# Patient Record
Sex: Female | Born: 1960 | Race: Black or African American | Hispanic: No | Marital: Single | Smoking: Never smoker
Health system: Southern US, Community
[De-identification: ages and names within clinical notes are randomized; demographics above are authoritative.]

## PROBLEM LIST (undated history)

## (undated) DIAGNOSIS — I10 Essential (primary) hypertension: Secondary | ICD-10-CM

## (undated) DIAGNOSIS — T7840XA Allergy, unspecified, initial encounter: Secondary | ICD-10-CM

## (undated) HISTORY — PX: ABDOMINAL HYSTERECTOMY: SHX81

## (undated) HISTORY — DX: Allergy, unspecified, initial encounter: T78.40XA

## (undated) HISTORY — PX: BREAST EXCISIONAL BIOPSY: SUR124

---

## 2017-09-18 ENCOUNTER — Other Ambulatory Visit: Payer: Self-pay | Admitting: Nurse Practitioner

## 2017-09-18 DIAGNOSIS — Z1231 Encounter for screening mammogram for malignant neoplasm of breast: Secondary | ICD-10-CM

## 2017-09-30 ENCOUNTER — Ambulatory Visit
Admission: RE | Admit: 2017-09-30 | Discharge: 2017-09-30 | Disposition: A | Discharge status: Home / Self Care | Payer: BLUE CROSS/BLUE SHIELD | Source: Ambulatory Visit | Attending: Specialist | Admitting: Specialist

## 2017-09-30 DIAGNOSIS — Z1231 Encounter for screening mammogram for malignant neoplasm of breast: Secondary | ICD-10-CM

## 2017-10-04 ENCOUNTER — Ambulatory Visit (INDEPENDENT_AMBULATORY_CARE_PROVIDER_SITE_OTHER): Payer: Worker's Compensation

## 2017-10-04 ENCOUNTER — Ambulatory Visit (HOSPITAL_COMMUNITY)
Admission: EM | Admit: 2017-10-04 | Discharge: 2017-10-04 | Disposition: A | Discharge status: Home / Self Care | Payer: Worker's Compensation | Attending: Family Medicine | Admitting: Family Medicine

## 2017-10-04 ENCOUNTER — Other Ambulatory Visit: Payer: Self-pay

## 2017-10-04 ENCOUNTER — Encounter (HOSPITAL_COMMUNITY): Payer: Self-pay

## 2017-10-04 DIAGNOSIS — S5011XA Contusion of right forearm, initial encounter: Secondary | ICD-10-CM

## 2017-10-04 DIAGNOSIS — M79631 Pain in right forearm: Secondary | ICD-10-CM

## 2017-10-04 DIAGNOSIS — W19XXXA Unspecified fall, initial encounter: Secondary | ICD-10-CM

## 2017-10-04 DIAGNOSIS — S134XXA Sprain of ligaments of cervical spine, initial encounter: Secondary | ICD-10-CM

## 2017-10-04 DIAGNOSIS — M542 Cervicalgia: Secondary | ICD-10-CM

## 2017-10-04 HISTORY — DX: Essential (primary) hypertension: I10

## 2017-10-04 NOTE — ED Triage Notes (Signed)
Fell at work 8 :30 pm . 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 Right forearm and neck ribs on the right side and lower back pain.

## 2017-10-04 NOTE — ED Provider Notes (Signed)
[facility]    CSN: [id number] Arrival date & time: 10/04/17  1653     History   Chief Complaint Chief Complaint  Patient presents with  . Fall    HPI Judith Richards is a 57 y.o. female.   Patient tripped over a pallet last night at work.  She landed primarily on her right forearm.  Several hours later she started to feel pain in her neck as well as her right chest and abdomen.  HPI  Past Medical History:  Diagnosis Date  . Hypertension     There are no active problems to display for this patient.   Past Surgical History:  Procedure Laterality Date  . ABDOMINAL HYSTERECTOMY    . BREAST EXCISIONAL BIOPSY Right over 15 years ago   benign    OB History   None      Home Medications    Prior to Admission medications   Medication Sig Start Date End Date Taking? Authorizing Provider  amlodipine-atorvastatin (CADUET) 10-10 MG tablet Take 1 tablet by mouth daily.   Yes [provider]  lisinopril (PRINIVIL,ZESTRIL) 40 MG tablet Take 40 mg by mouth daily.   Yes [provider]    Family History Family History  Problem Relation Age of Onset  . Hypertension Mother   . Asthma Mother     Social History Social History   Tobacco Use  . Smoking status: Never Smoker  . Smokeless tobacco: Never Used  Substance Use Topics  . Alcohol use: Yes    Comment: occ  . Drug use: Not Currently     Allergies   Patient has no allergy information on record.   Review of Systems Review of Systems  Constitutional: Negative.   HENT: Negative.   Respiratory: Negative.   Cardiovascular: Negative.   Gastrointestinal: Positive for abdominal pain.  Musculoskeletal: Positive for neck pain.     Physical Exam Triage Vital Signs ED Triage Vitals  Enc Vitals Group     BP 10/04/17 1810 (!) 173/93     Pulse Rate 10/04/17 1810 73     Resp 10/04/17 1810 16     Temp 10/04/17 1810 98.6 F (37 C)     Temp src --      SpO2 --      Weight 10/04/17  1814 183 lb (83 kg)     Height --      Head Circumference --      Peak Flow --      Pain Score 10/04/17 1814 8     Pain Loc --      Pain Edu? --      Excl. in [place]? --    No data found.  Updated Vital Signs BP (!) 173/93   Pulse 73   Temp 98.6 F (37 C)   Resp 16   Wt 183 lb (83 kg)   Visual Acuity Right Eye Distance:   Left Eye Distance:   Bilateral Distance:    Right Eye Near:   Left Eye Near:    Bilateral Near:     Physical Exam  Constitutional: She appears well-developed and well-nourished.  Cardiovascular:  There is no point tenderness involving the ribs with compression  Pulmonary/Chest: Effort normal.  Abdominal: Soft.  Musculoskeletal:  Right forearm there is bruising and swelling on the volar aspect of the arm.  Elbow has normal range of motion as does the wrist.     UC Treatments / Results  Labs (all labs ordered are  listed, but only abnormal results are displayed) Labs Reviewed - No data to display  EKG None  Radiology No results found.  Procedures Procedures (including critical care time)  Medications Ordered in UC Medications - No data to display  Initial Impression / Assessment and Plan / UC Course  I have reviewed the triage vital signs and the nursing notes.  Pertinent labs & imaging results that were available during my care of the patient were reviewed by me and considered in my medical decision making (see chart for details).     Contusion of right forearm.  Sprain of cervical spine.  Have recommended activities as tolerated and follow-up with occupational health. Final Clinical Impressions(s) / UC Diagnoses   Final diagnoses:  None   Discharge Instructions   None    ED Prescriptions    None     Controlled Substance Prescriptions [place] Controlled Substance Registry consulted? No   Wardell Honour, MD 10/04/17 2005

## 2017-12-18 ENCOUNTER — Encounter: Payer: Self-pay | Admitting: Gastroenterology

## 2018-01-16 ENCOUNTER — Telehealth: Payer: Self-pay | Admitting: *Deleted

## 2018-01-16 ENCOUNTER — Encounter: Payer: Self-pay | Admitting: *Deleted

## 2018-01-16 NOTE — Telephone Encounter (Signed)
Patient no showed for Previsit today. Telephone message left for the patient with call back #. Informed patient to call by 1700 to avoid cancellation of previsit and procedure Called patient at 1703, no answer. Procedure and previsit cancelled

## 2018-01-17 ENCOUNTER — Encounter: Payer: Self-pay | Admitting: Gastroenterology

## 2018-01-22 ENCOUNTER — Ambulatory Visit (AMBULATORY_SURGERY_CENTER): Payer: Self-pay | Admitting: *Deleted

## 2018-01-22 VITALS — Ht 62.0 in | Wt 189.0 lb

## 2018-01-22 DIAGNOSIS — Z800 Family history of malignant neoplasm of digestive organs: Secondary | ICD-10-CM

## 2018-01-22 MED ORDER — NA SULFATE-K SULFATE-MG SULF 17.5-3.13-1.6 GM/177ML PO SOLN: 1.0000 | Freq: Once | ORAL | 0 refills | Status: AC

## 2018-01-22 NOTE — Progress Notes (Signed)
No egg or soy allergy known to patient  No issues with past sedation with any surgeries  or procedures, no intubation problems  No diet pills per patient No home 02 use per patient  No blood thinners per patient  Pt denies issues with constipation  No A fib or A flutter  EMMI video sent to pt's e mail --  Suprep $15 coupon to pt

## 2018-01-23 ENCOUNTER — Encounter: Payer: Self-pay | Admitting: Gastroenterology

## 2018-02-05 ENCOUNTER — Encounter: Payer: Self-pay | Admitting: Gastroenterology

## 2018-02-19 ENCOUNTER — Encounter: Payer: Self-pay | Admitting: Gastroenterology

## 2018-02-19 ENCOUNTER — Ambulatory Visit (AMBULATORY_SURGERY_CENTER): Payer: BLUE CROSS/BLUE SHIELD | Admitting: Gastroenterology

## 2018-02-19 VITALS — BP 166/73 | HR 60 | Temp 96.0°F | Resp 12 | Ht 62.0 in | Wt 189.0 lb

## 2018-02-19 DIAGNOSIS — K635 Polyp of colon: Secondary | ICD-10-CM

## 2018-02-19 DIAGNOSIS — Z1211 Encounter for screening for malignant neoplasm of colon: Secondary | ICD-10-CM | POA: Diagnosis present

## 2018-02-19 DIAGNOSIS — Z800 Family history of malignant neoplasm of digestive organs: Secondary | ICD-10-CM

## 2018-02-19 DIAGNOSIS — K5730 Diverticulosis of large intestine without perforation or abscess without bleeding: Secondary | ICD-10-CM

## 2018-02-19 DIAGNOSIS — D120 Benign neoplasm of cecum: Secondary | ICD-10-CM

## 2018-02-19 MED ORDER — SODIUM CHLORIDE 0.9 % IV SOLN: 500.0000 mL | Freq: Once | INTRAVENOUS | Status: DC

## 2018-02-19 NOTE — Progress Notes (Signed)
Report to PACU, RN, vss, BBS= Clear.  

## 2018-02-19 NOTE — Progress Notes (Signed)
Called to room to assist during endoscopic procedure.  Patient ID and intended procedure confirmed with present staff. Received instructions for my participation in the procedure from the performing physician.  

## 2018-02-19 NOTE — Op Note (Signed)
[facility] Patient Name: Judith Richards Procedure Date: 02/19/2018 8:23 AM MRN: [id number] Endoscopist: Gerrit Heck , MD Age: 57 Referring MD:  Date of Birth: [birth year] Gender: Female Account #: [id number] Procedure:                Colonoscopy Indications:              Screening for colorectal malignant neoplasm, This                            is the patient's first colonoscopy Medicines:                Monitored Anesthesia Care Procedure:                Pre-Anesthesia Assessment:                           - Prior to the procedure, a History and Physical                            was performed, and patient medications and                            allergies were reviewed. The patient's tolerance of                            previous anesthesia was also reviewed. The risks                            and benefits of the procedure and the sedation                            options and risks were discussed with the patient.                            All questions were answered, and informed consent                            was obtained. Prior Anticoagulants: The patient has                            taken no previous anticoagulant or antiplatelet                            agents. ASA Grade Assessment: II - A patient with                            mild systemic disease. After reviewing the risks                            and benefits, the patient was deemed in                            satisfactory condition to undergo the procedure.  After obtaining informed consent, the colonoscope                            was passed under direct vision. Throughout the                            procedure, the patient's blood pressure, pulse, and                            oxygen saturations were monitored continuously. The                            Colonoscope was introduced through the anus and                            advanced to the the  cecum, identified by the                            appendiceal orifice, ileocecal valve and palpation.                            The colonoscopy was performed without difficulty.                            The patient tolerated the procedure well. The                            quality of the bowel preparation was adequate. Scope In: 8:41:07 AM Scope Out: 8:53:15 AM Scope Withdrawal Time: 0 hours 9 minutes 2 seconds  Total Procedure Duration: 0 hours 12 minutes 8 seconds  Findings:                 The perianal and digital rectal examinations were                            normal.                           A 2 mm polyp was found in the cecum. The polyp was                            sessile. The polyp was removed with a cold biopsy                            forceps. Resection and retrieval were complete.                            Estimated blood loss was minimal.                           A few small-mouthed diverticula were found in the                            sigmoid colon and transverse colon.  An area of mild melanosis was found in the sigmoid                            colon and in the distal descending colon.                           The exam was otherwise normal throughout the                            examined colon.                           Retroflexion in the right colon was performed.                           The retroflexed view of the distal rectum and anal                            verge was normal and showed no anal or rectal                            abnormalities.                           The terminal ileum appeared normal. Complications:            No immediate complications. Estimated Blood Loss:     Estimated blood loss was minimal. Impression:               - One 2 mm polyp in the cecum, removed with a cold                            biopsy forceps. Resected and retrieved.                           - Diverticulosis in the  sigmoid colon and in the                            transverse colon.                           - Melanosis in the colon.                           - The distal rectum and anal verge are normal on                            retroflexion view.                           - The examined portion of the ileum was normal. Recommendation:           - Patient has a contact number available for                            emergencies. The signs  and symptoms of potential                            delayed complications were discussed with the                            patient. Return to normal activities tomorrow.                            Written discharge instructions were provided to the                            patient.                           - Resume previous diet today.                           - Continue present medications.                           - Await pathology results.                           - Repeat colonoscopy in 5-10 years for surveillance                            based on pathology results.                           - Return to GI office PRN. Gerrit Heck, MD 02/19/2018 8:59:00 AM

## 2018-02-19 NOTE — Progress Notes (Signed)
Pt's states no medical or surgical changes since previsit or office visit. 

## 2018-02-19 NOTE — Patient Instructions (Signed)
YOU HAD AN ENDOSCOPIC PROCEDURE TODAY AT [facility]:   Refer to the procedure report that was given to you for any specific questions about what was found during the examination.  If the procedure report does not answer your questions, please call your gastroenterologist to clarify.  If you requested that your care partner not be given the details of your procedure findings, then the procedure report has been included in a sealed envelope for you to review at your convenience later.  YOU SHOULD EXPECT: Some feelings of bloating in the abdomen. Passage of more gas than usual.  Walking can help get rid of the air that was put into your GI tract during the procedure and reduce the bloating. If you had a lower endoscopy (such as a colonoscopy or flexible sigmoidoscopy) you may notice spotting of blood in your stool or on the toilet paper. If you underwent a bowel prep for your procedure, you may not have a normal bowel movement for a few days.  Please Note:  You might notice some irritation and congestion in your nose or some drainage.  This is from the oxygen used during your procedure.  There is no need for concern and it should clear up in a day or so.  SYMPTOMS TO REPORT IMMEDIATELY:   Following lower endoscopy (colonoscopy or flexible sigmoidoscopy):  Excessive amounts of blood in the stool  Significant tenderness or worsening of abdominal pains  Swelling of the abdomen that is new, acute  Fever of 100F or higher   For urgent or emergent issues, a gastroenterologist can be reached at any hour by calling [phone].   DIET:  We do recommend a small meal at first, but then you may proceed to your regular diet.  Drink plenty of fluids but you should avoid alcoholic beverages for 24 hours.  ACTIVITY:  You should plan to take it easy for the rest of today and you should NOT DRIVE or use heavy machinery until tomorrow (because of the sedation medicines used during the test).     FOLLOW UP: Our staff will call the number listed on your records the next business day following your procedure to check on you and address any questions or concerns that you may have regarding the information given to you following your procedure. If we do not reach you, we will leave a message.  However, if you are feeling well and you are not experiencing any problems, there is no need to return our call.  We will assume that you have returned to your regular daily activities without incident.  If any biopsies were taken you will be contacted by phone or by letter within the next 1-3 weeks.  Please call us at [phone] if you have not heard about the biopsies in 3 weeks.    SIGNATURES/CONFIDENTIALITY: You and/or your care partner have signed paperwork which will be entered into your electronic medical record.  These signatures attest to the fact that that the information above on your After Visit Summary has been reviewed and is understood.  Full responsibility of the confidentiality of this discharge information lies with you and/or your care-partner.   Handouts were given to your care partner on polyps and diverticulosis. You may resume your current medications today. Await biopsy results. Please call if any questions or concerns.

## 2018-02-19 NOTE — Progress Notes (Signed)
No problems noted in the recovery room. maw 

## 2018-02-20 ENCOUNTER — Telehealth: Payer: Self-pay | Admitting: *Deleted

## 2018-02-20 NOTE — Telephone Encounter (Signed)
  Follow up Call-  Call back number 02/19/2018  Post procedure Call Back phone  # [phone]  Permission to leave phone message Yes  Some recent data might be hidden     Patient questions:  Do you have a fever, pain , or abdominal swelling? No. Pain Score  0 *  Have you tolerated food without any problems? Yes.    Have you been able to return to your normal activities? Yes.    Do you have any questions about your discharge instructions: Diet   No. Medications  No. Follow up visit  No.  Do you have questions or concerns about your Care? No.  Actions: * If pain score is 4 or above: No action needed, pain <4.

## 2018-03-14 ENCOUNTER — Encounter: Payer: Self-pay | Admitting: Gastroenterology

## 2018-04-27 ENCOUNTER — Ambulatory Visit (INDEPENDENT_AMBULATORY_CARE_PROVIDER_SITE_OTHER): Payer: Worker's Compensation

## 2018-04-27 ENCOUNTER — Ambulatory Visit (HOSPITAL_COMMUNITY)
Admission: EM | Admit: 2018-04-27 | Discharge: 2018-04-27 | Disposition: A | Discharge status: Home / Self Care | Payer: Worker's Compensation | Attending: Internal Medicine | Admitting: Internal Medicine

## 2018-04-27 ENCOUNTER — Encounter (HOSPITAL_COMMUNITY): Payer: Self-pay | Admitting: Emergency Medicine

## 2018-04-27 DIAGNOSIS — W19XXXA Unspecified fall, initial encounter: Secondary | ICD-10-CM

## 2018-04-27 DIAGNOSIS — S8002XA Contusion of left knee, initial encounter: Secondary | ICD-10-CM

## 2018-04-27 DIAGNOSIS — I10 Essential (primary) hypertension: Secondary | ICD-10-CM

## 2018-04-27 DIAGNOSIS — W01198A Fall on same level from slipping, tripping and stumbling with subsequent striking against other object, initial encounter: Secondary | ICD-10-CM

## 2018-04-27 DIAGNOSIS — M25552 Pain in left hip: Secondary | ICD-10-CM

## 2018-04-27 DIAGNOSIS — Y9269 Other specified industrial and construction area as the place of occurrence of the external cause: Secondary | ICD-10-CM

## 2018-04-27 DIAGNOSIS — S8001XA Contusion of right knee, initial encounter: Secondary | ICD-10-CM

## 2018-04-27 DIAGNOSIS — S79912A Unspecified injury of left hip, initial encounter: Secondary | ICD-10-CM

## 2018-04-27 DIAGNOSIS — M545 Low back pain: Secondary | ICD-10-CM

## 2018-04-27 DIAGNOSIS — M25512 Pain in left shoulder: Secondary | ICD-10-CM

## 2018-04-27 MED ORDER — IBUPROFEN 600 MG PO TABS: 600.0000 mg | ORAL_TABLET | Freq: Four times a day (QID) | ORAL | 0 refills | Status: AC | PRN

## 2018-04-27 MED ORDER — CYCLOBENZAPRINE HCL 5 MG PO TABS: 5.0000 mg | ORAL_TABLET | Freq: Two times a day (BID) | ORAL | 0 refills | Status: AC | PRN

## 2018-04-27 NOTE — Discharge Instructions (Signed)
Use anti-inflammatories for pain/swelling. You may take up to 600 mg Ibuprofen every 8 hours with food. You may supplement Ibuprofen with Tylenol [id number] mg every 8 hours.   You may use flexeril as needed to help with pain. This is a muscle relaxer and causes sedation- please use only at bedtime or when you will be home and not have to drive/work- begin with 1 tablet may increase to 2 if needed  Ice and heat  Avoid heavy lifting/ avoid complete bed rest  Follow up if not resolving or worsening

## 2018-04-27 NOTE — ED Triage Notes (Signed)
Pt sts fall at work on Thursday c/o left shoulder, bilateral knee and left hip pain

## 2018-04-27 NOTE — ED Provider Notes (Signed)
[facility] [facility]    CSN: [id number] Arrival date & time: 04/27/18  1054     History   Chief Complaint Chief Complaint  Patient presents with  . Fall    HPI Judith Richards is a 58 y.o. female history of hypertension presenting today for evaluation of left shoulder and left hip pain secondary to fall.  Patient states that she fell and tripped over a pallet approximately 3 to 4 days ago while she was at work.  She jammed to catch herself but landed on the left side of her body and her knees.  Since she has had pain in her lower back which worsens with sitting for a long time.  She denies any difficulty moving her extremities but does endorse pain with movement.  She has not taken anything for symptoms.  Denies numbness or tingling.  Denies saddle anesthesia.  Denies changes in urination or bowel movements.  Denies hitting head or loss of consciousness during fall.  HPI  Past Medical History:  Diagnosis Date  . Allergy   . Hypertension     There are no active problems to display for this patient.   Past Surgical History:  Procedure Laterality Date  . ABDOMINAL HYSTERECTOMY    . BREAST EXCISIONAL BIOPSY Right over 15 years ago   benign    OB History   No obstetric history on file.      Home Medications    Prior to Admission medications   Medication Sig Start Date End Date Taking? Authorizing Provider  amlodipine-atorvastatin (CADUET) 10-10 MG tablet Take 1 tablet by mouth daily.    [provider]  Black Cohosh-Soy-Ginkgo-Magnol (ESTROVEN MOOD & MEMORY PO) Take by mouth daily.    [provider]  cyclobenzaprine (FLEXERIL) 5 MG tablet Take 1-2 tablets (5-10 mg total) by mouth 2 (two) times daily as needed for muscle spasms. 04/27/18   Jashawn Floyd C, PA-C  ibuprofen (ADVIL,MOTRIN) 600 MG tablet Take 1 tablet (600 mg total) by mouth every 6 (six) hours as needed. 04/27/18   Lakesia Dahle C, PA-C  Magnesium-Potassium 250-100 MG TABS Take by  mouth.    [provider]  naproxen sodium (ALEVE) 220 MG tablet Take 220 mg by mouth 2 (two) times daily as needed.    [provider]  Omega-3 Fatty Acids (OMEGA 3 500 PO) Take by mouth.    [provider]  OVER THE COUNTER MEDICATION Focus Factor nutrition for Brain    [provider]  tizanidine (ZANAFLEX) 2 MG capsule Take 2 mg by mouth 3 (three) times daily as needed for muscle spasms.    [provider]  triamterene-hydrochlorothiazide (MAXZIDE-25) 37.5-25 MG tablet Take 1 tablet by mouth daily. 1/2 tablet daily    [provider]    Family History Family History  Problem Relation Age of Onset  . Hypertension Mother   . Asthma Mother   . Colon cancer Father        dx'd in his 68's   . Diabetes Father   . Colon polyps Neg Hx   . Rectal cancer Neg Hx   . Stomach cancer Neg Hx   . Esophageal cancer Neg Hx     Social History Social History   Tobacco Use  . Smoking status: Never Smoker  . Smokeless tobacco: Never Used  Substance Use Topics  . Alcohol use: Yes    Comment: occ  . Drug use: Not Currently     Allergies   Patient has  no known allergies.   Review of Systems Review of Systems  Constitutional: Negative for fatigue and fever.  Eyes: Negative for visual disturbance.  Respiratory: Negative for shortness of breath.   Cardiovascular: Negative for chest pain.  Gastrointestinal: Negative for abdominal pain, nausea and vomiting.  Musculoskeletal: Positive for arthralgias, back pain, joint swelling and myalgias.  Skin: Positive for color change. Negative for rash and wound.  Neurological: Negative for dizziness, weakness, light-headedness and headaches.     Physical Exam Triage Vital Signs ED Triage Vitals [04/27/18 1127]  Enc Vitals Group     BP (!) 169/81     Pulse Rate 82     Resp 18     Temp 97.8 F (36.6 C)     Temp Source Temporal     SpO2 100 %     Weight      Height      Head Circumference       Peak Flow      Pain Score 6     Pain Loc      Pain Edu?      Excl. in [place]?    No data found.  Updated Vital Signs BP (!) 169/81 (BP Location: Right Arm)   Pulse 82   Temp 97.8 F (36.6 C) (Temporal)   Resp 18   SpO2 100%   Visual Acuity Right Eye Distance:   Left Eye Distance:   Bilateral Distance:    Right Eye Near:   Left Eye Near:    Bilateral Near:     Physical Exam Vitals signs and nursing note reviewed.  Constitutional:      General: She is not in acute distress.    Appearance: She is well-developed.  HENT:     Head: Normocephalic and atraumatic.  Eyes:     Conjunctiva/sclera: Conjunctivae normal.  Neck:     Musculoskeletal: Neck supple.  Cardiovascular:     Rate and Rhythm: Normal rate and regular rhythm.     Heart sounds: No murmur.  Pulmonary:     Effort: Pulmonary effort is normal. No respiratory distress.     Breath sounds: Normal breath sounds.     Comments: Breathing comfortably at rest, CTABL, no wheezing, rales or other adventitious sounds auscultated Abdominal:     Palpations: Abdomen is soft.     Tenderness: There is no abdominal tenderness.  Musculoskeletal:     Comments: Left shoulder: No obvious swelling or deformity, mild tenderness to palpation over distal clavicle, nontender over scapular spine, full active range of motion of shoulder although does elicit pain, significant tenderness throughout trapezius as well as periscapular area of thoracic spine on left side  Nontender to palpation over cervical, thoracic and lumbar spine midline increase tenderness to left lateral lumbar musculature extending over upper gluteal area  Strength 5/5 and equal bilaterally throughout hips, hip flexion on left side does elicit pain Knee strength 5/5, reflexes 2+ patellar  Bilateral knees: Mild bruising over left medial area, full active range of motion of bilateral knees, nontender over patella  Able to ambulate from chair to exam table without  assistance or abnormality  Skin:    General: Skin is warm and dry.  Neurological:     Mental Status: She is alert.      UC Treatments / Results  Labs (all labs ordered are listed, but only abnormal results are displayed) Labs Reviewed - No data to display  EKG None  Radiology Dg Pelvis 1-2 Views  Result Date: 04/27/2018  CLINICAL DATA:  Status post fall with pain along the left hip. EXAM: PELVIS - 1-2 VIEW COMPARISON:  None. FINDINGS: There is no evidence of pelvic fracture or diastasis. No pelvic bone lesions are seen. IMPRESSION: Negative. Electronically Signed   By: Fidela Salisbury M.D.   On: 04/27/2018 13:05    Procedures Procedures (including critical care time)  Medications Ordered in UC Medications - No data to display  Initial Impression / Assessment and Plan / UC Course  I have reviewed the triage vital signs and the nursing notes.  Pertinent labs & imaging results that were available during my care of the patient were reviewed by me and considered in my medical decision making (see chart for details).     X-ray negative; symptoms seem to be more muscular strains as cause of pain, full active range of motion.  Will recommend anti-inflammatories and muscle relaxers.  Ice and heat.  Continue to monitor,Discussed strict return precautions. Patient verbalized understanding and is agreeable with plan.  Final Clinical Impressions(s) / UC Diagnoses   Final diagnoses:  Fall, initial encounter     Discharge Instructions     Use anti-inflammatories for pain/swelling. You may take up to 600 mg Ibuprofen every 8 hours with food. You may supplement Ibuprofen with Tylenol [id number] mg every 8 hours.   You may use flexeril as needed to help with pain. This is a muscle relaxer and causes sedation- please use only at bedtime or when you will be home and not have to drive/work- begin with 1 tablet may increase to 2 if needed  Ice and heat  Avoid heavy lifting/ avoid  complete bed rest  Follow up if not resolving or worsening      ED Prescriptions    Medication Sig Dispense Auth. Provider   ibuprofen (ADVIL,MOTRIN) 600 MG tablet Take 1 tablet (600 mg total) by mouth every 6 (six) hours as needed. 30 tablet Esthefany Herrig C, PA-C   cyclobenzaprine (FLEXERIL) 5 MG tablet Take 1-2 tablets (5-10 mg total) by mouth 2 (two) times daily as needed for muscle spasms. 24 tablet Miliani Deike, [place] C, PA-C     Controlled Substance Prescriptions [place] Controlled Substance Registry consulted? Not Applicable   Janith Lima, [place] 04/27/18 1318

## 2018-05-05 ENCOUNTER — Telehealth (HOSPITAL_COMMUNITY): Payer: Self-pay | Admitting: Emergency Medicine

## 2018-05-05 NOTE — Telephone Encounter (Signed)
Pt called requesting different work note; pt was given note at visit and explained to pt that she needs to follow up with workers comp per her employers policy; pt verbalized understanding

## 2018-08-06 ENCOUNTER — Ambulatory Visit (HOSPITAL_COMMUNITY)
Admission: EM | Admit: 2018-08-06 | Discharge: 2018-08-06 | Disposition: A | Discharge status: Home / Self Care | Payer: Self-pay

## 2018-08-06 NOTE — ED Notes (Signed)
Pt went to occupational health to be seen since it was a work related injury

## 2019-07-06 IMAGING — DX DG PELVIS 1-2V
1 series · 1 of 1 positions shown · non-contrast
Comparison: None.

CLINICAL DATA: Status post fall with pain along the left hip.

EXAM:
PELVIS - 1-2 VIEW

[pelvis ap]
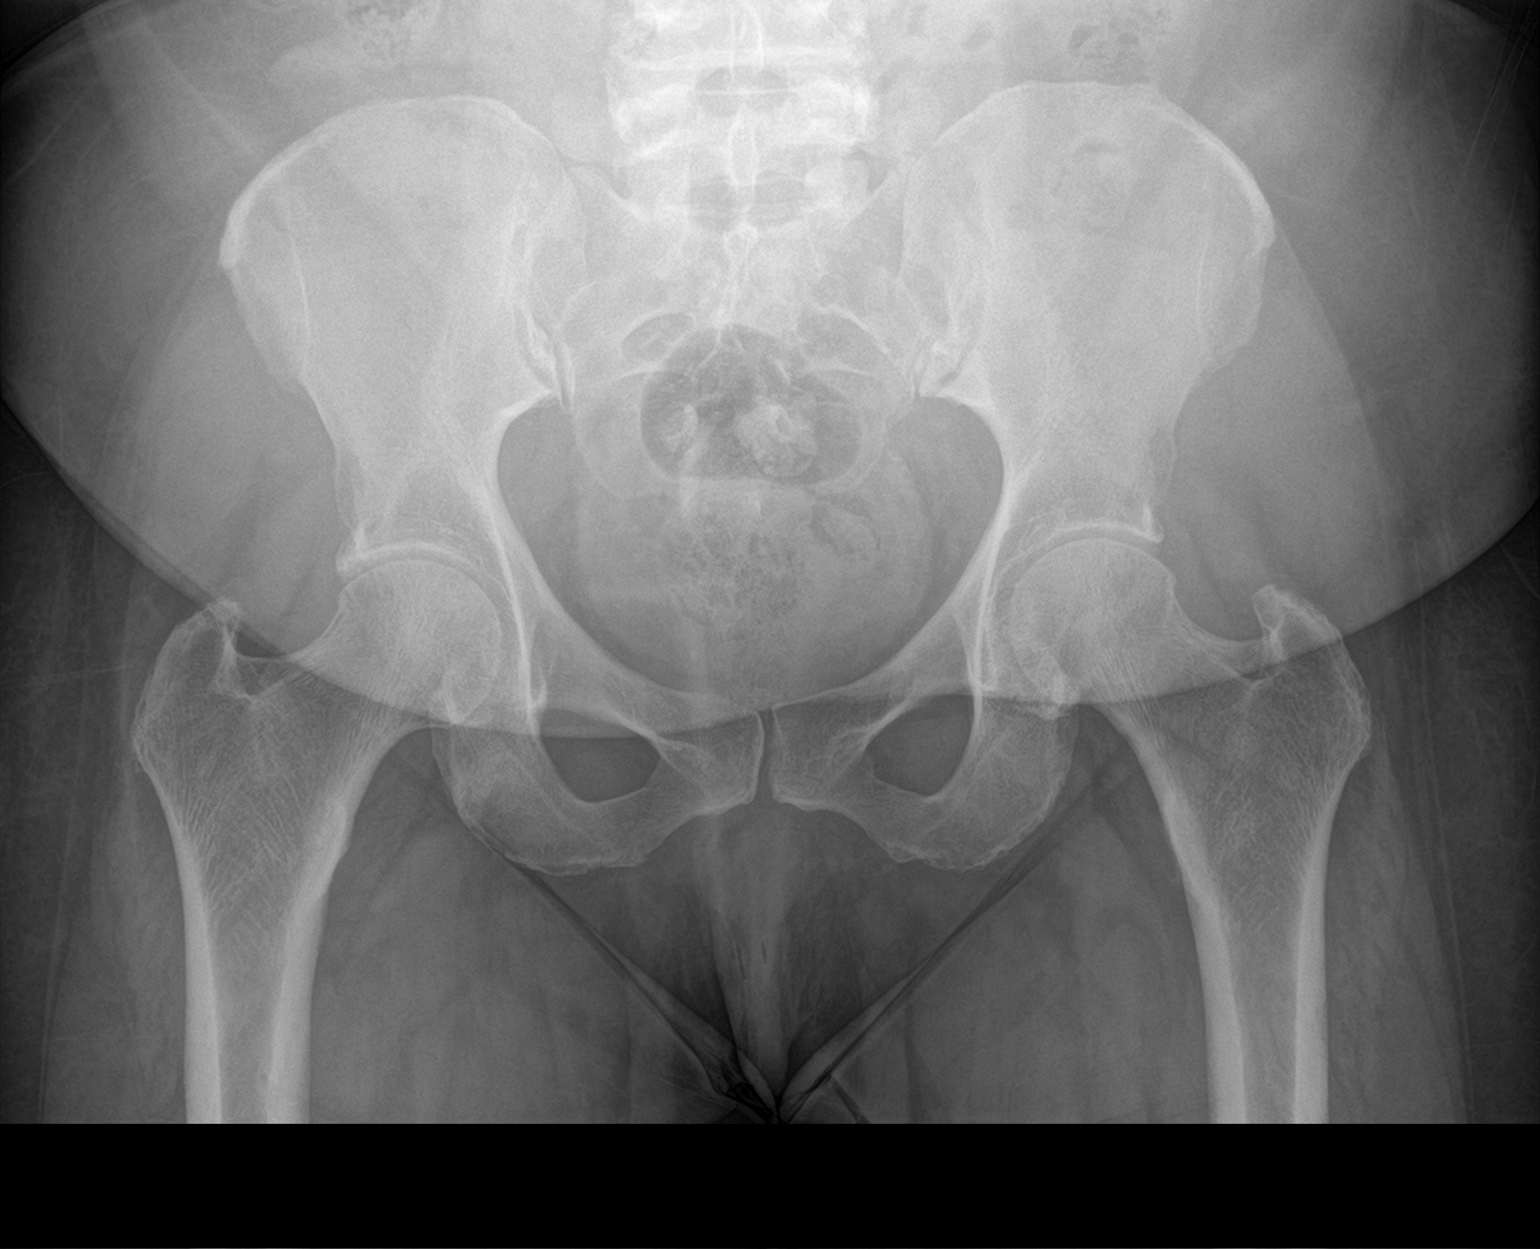

[1 of 1 positions shown; findings below may reference images not displayed]

FINDINGS: There is no evidence of pelvic fracture or diastasis. No pelvic bone
lesions are seen.
IMPRESSION: Negative.

## 2023-04-10 ENCOUNTER — Other Ambulatory Visit: Payer: Self-pay | Admitting: Obstetrics and Gynecology

## 2023-04-10 DIAGNOSIS — Z1231 Encounter for screening mammogram for malignant neoplasm of breast: Secondary | ICD-10-CM

## 2023-05-23 ENCOUNTER — Inpatient Hospital Stay: Admission: RE | Admit: 2023-05-23 | Payer: Self-pay | Source: Ambulatory Visit

## 2023-05-23 ENCOUNTER — Ambulatory Visit: Payer: Self-pay
